# Patient Record
Sex: Female | Born: 1975 | Race: White | Hispanic: No | Marital: Married | Smoking: Former smoker
Health system: Southern US, Community
[De-identification: ages and names within clinical notes are randomized; demographics above are authoritative.]

---

## 2001-08-22 ENCOUNTER — Other Ambulatory Visit: Admission: RE | Admit: 2001-08-22 | Discharge: 2001-08-22 | Payer: Self-pay | Admitting: Obstetrics and Gynecology

## 2001-10-22 ENCOUNTER — Ambulatory Visit (HOSPITAL_COMMUNITY): Admission: RE | Admit: 2001-10-22 | Discharge: 2001-10-22 | Payer: Self-pay | Admitting: Obstetrics and Gynecology

## 2001-10-22 ENCOUNTER — Encounter: Payer: Self-pay | Admitting: Obstetrics and Gynecology

## 2001-11-11 ENCOUNTER — Ambulatory Visit (HOSPITAL_COMMUNITY): Admission: RE | Admit: 2001-11-11 | Discharge: 2001-11-11 | Payer: Self-pay | Admitting: Obstetrics and Gynecology

## 2001-11-11 ENCOUNTER — Encounter: Payer: Self-pay | Admitting: Obstetrics and Gynecology

## 2001-12-03 ENCOUNTER — Encounter: Admission: RE | Admit: 2001-12-03 | Discharge: 2001-12-03 | Payer: Self-pay | Admitting: Obstetrics and Gynecology

## 2001-12-17 ENCOUNTER — Ambulatory Visit (HOSPITAL_COMMUNITY): Admission: RE | Admit: 2001-12-17 | Discharge: 2001-12-17 | Payer: Self-pay | Admitting: Obstetrics and Gynecology

## 2001-12-17 ENCOUNTER — Encounter: Payer: Self-pay | Admitting: Obstetrics and Gynecology

## 2002-01-03 ENCOUNTER — Encounter: Payer: Self-pay | Admitting: Obstetrics and Gynecology

## 2002-01-03 ENCOUNTER — Ambulatory Visit (HOSPITAL_COMMUNITY): Admission: RE | Admit: 2002-01-03 | Discharge: 2002-01-03 | Payer: Self-pay | Admitting: Obstetrics and Gynecology

## 2002-01-31 ENCOUNTER — Encounter: Payer: Self-pay | Admitting: Obstetrics and Gynecology

## 2002-01-31 ENCOUNTER — Ambulatory Visit (HOSPITAL_COMMUNITY): Admission: RE | Admit: 2002-01-31 | Discharge: 2002-01-31 | Payer: Self-pay | Admitting: Obstetrics and Gynecology

## 2002-02-17 ENCOUNTER — Inpatient Hospital Stay (HOSPITAL_COMMUNITY): Admission: AD | Admit: 2002-02-17 | Discharge: 2002-02-20 | Payer: Self-pay | Admitting: Obstetrics and Gynecology

## 2002-02-21 ENCOUNTER — Encounter: Admission: RE | Admit: 2002-02-21 | Discharge: 2002-03-23 | Payer: Self-pay | Admitting: Obstetrics and Gynecology

## 2011-05-05 ENCOUNTER — Other Ambulatory Visit: Payer: Self-pay | Admitting: Otolaryngology

## 2011-05-05 DIAGNOSIS — J329 Chronic sinusitis, unspecified: Secondary | ICD-10-CM

## 2011-05-12 ENCOUNTER — Ambulatory Visit
Admission: RE | Admit: 2011-05-12 | Discharge: 2011-05-12 | Disposition: A | Discharge status: Home / Self Care | Payer: 59 | Source: Ambulatory Visit | Attending: Otolaryngology | Admitting: Otolaryngology

## 2011-05-12 DIAGNOSIS — J329 Chronic sinusitis, unspecified: Secondary | ICD-10-CM

## 2012-02-14 ENCOUNTER — Ambulatory Visit
Admission: RE | Admit: 2012-02-14 | Discharge: 2012-02-14 | Disposition: A | Discharge status: Home / Self Care | Payer: BC Managed Care – PPO | Source: Ambulatory Visit | Attending: Otolaryngology | Admitting: Otolaryngology

## 2012-02-14 ENCOUNTER — Other Ambulatory Visit: Payer: Self-pay | Admitting: Otolaryngology

## 2012-02-14 DIAGNOSIS — J329 Chronic sinusitis, unspecified: Secondary | ICD-10-CM

## 2012-03-08 ENCOUNTER — Other Ambulatory Visit: Payer: Self-pay | Admitting: Otolaryngology

## 2013-11-19 ENCOUNTER — Other Ambulatory Visit: Payer: Self-pay | Admitting: Obstetrics & Gynecology

## 2013-11-20 LAB — CYTOLOGY - PAP

## 2014-11-27 IMAGING — CT CT MAXILLOFACIAL W/O CM
3 series · 16 of 37 positions shown, 19 images · non-contrast
Comparison: CT scan 05/12/2011.

CLINICAL DATA: Sinusitis.  Fusion protocol.

CT MAXILLOFACIAL WITHOUT CONTRAST
TECHNIQUE: Multidetector CT imaging of the maxillofacial
structures was performed. Multiplanar CT image reconstructions were
also generated. Fusion protocol used.

[Series 3: axial soft 1.25 · axial · 0.46mm/px · z∈[-59,-42]mm · 2 of 116 slices shown]
[im 8/116  brain]
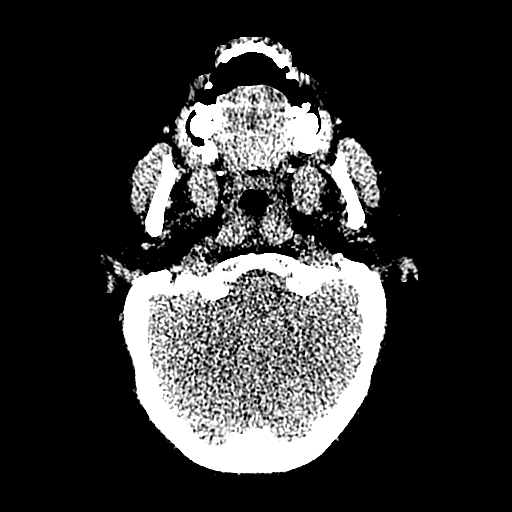
[im 22/116  brain]
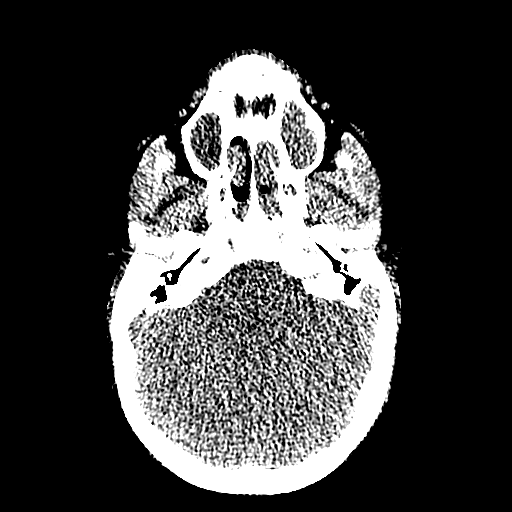

[Series 601: coronal facial · coronal · 0.46mm/px · 3 of 112 slices shown]
[im 38/112  bone]
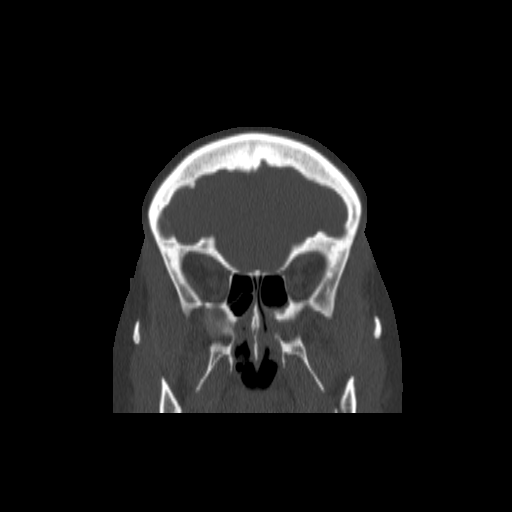
[im 56/112  bone]
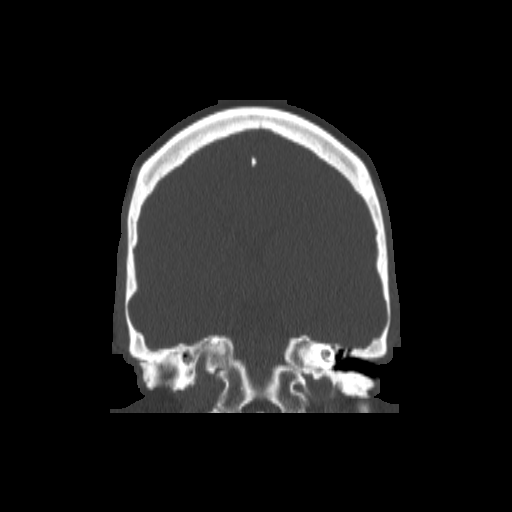
[im 75/112  bone]
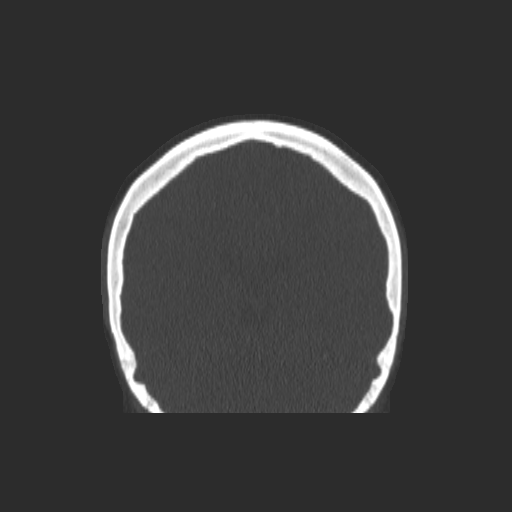

[Series 602: sagittal facial · sagittal · 0.46mm/px · 11 of 90 slices shown, 14 images]
[im 8/90  brain]
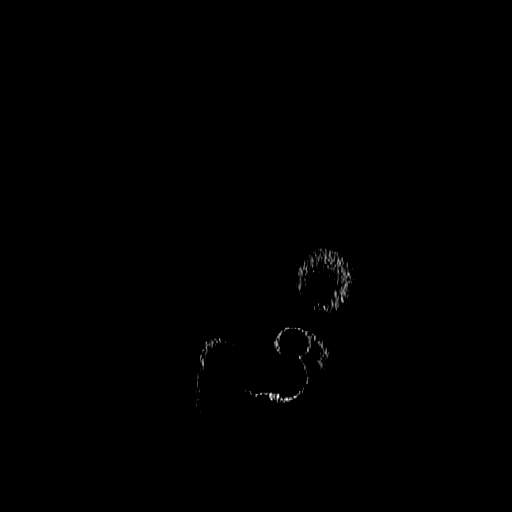
[im 8/90  bone]
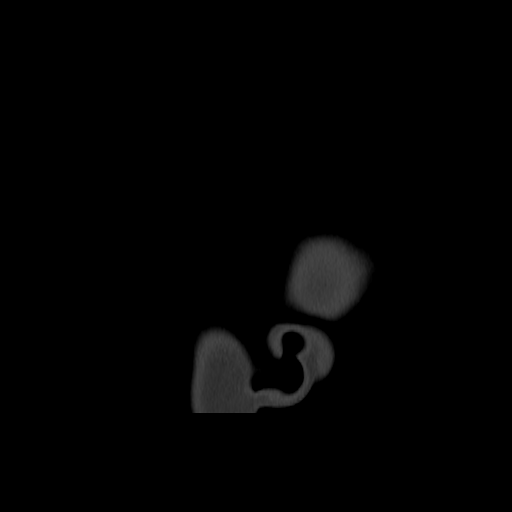
[im 15/90  bone]
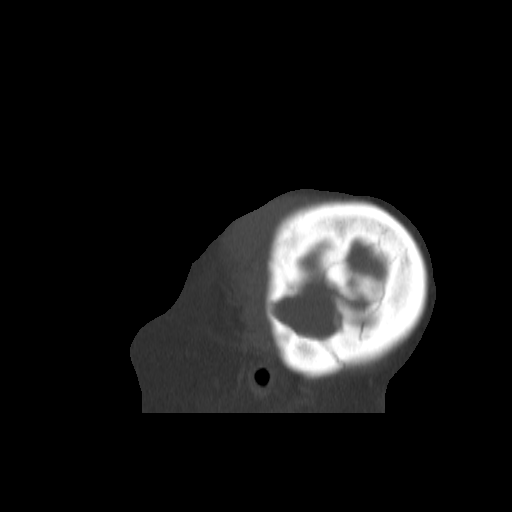
[im 23/90  bone]
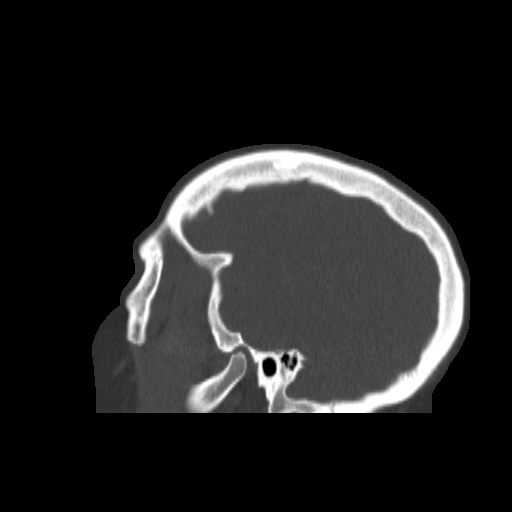
[im 30/90  bone]
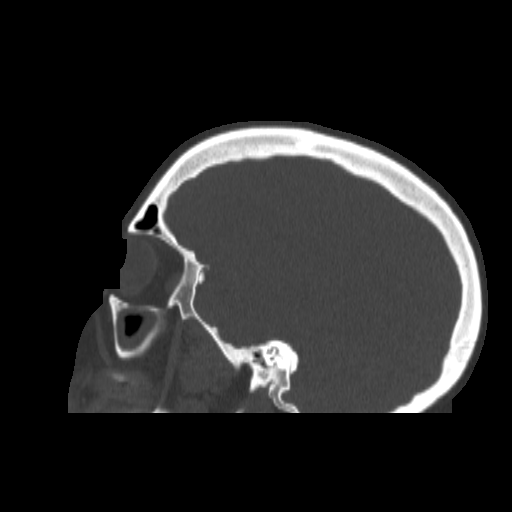
[im 38/90  brain]
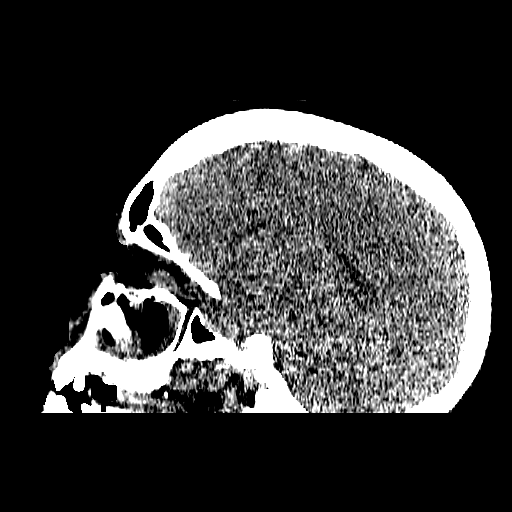
[im 38/90  bone]
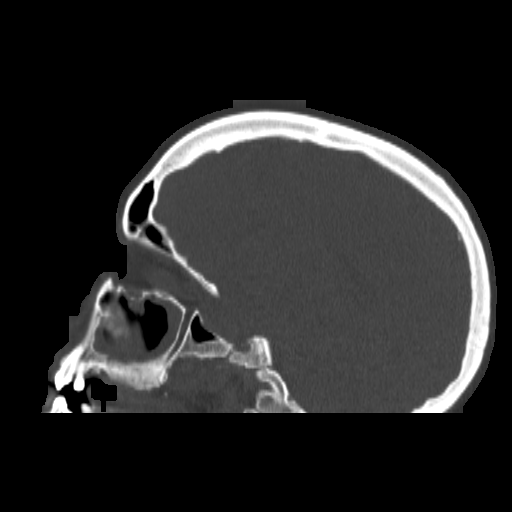
[im 45/90  bone]
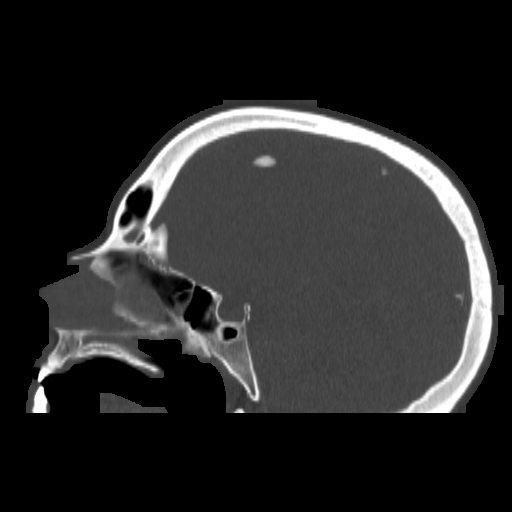
[im 52/90  bone]
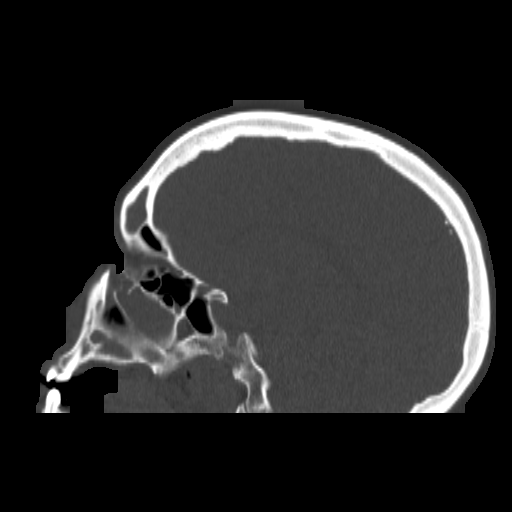
[im 60/90  bone]
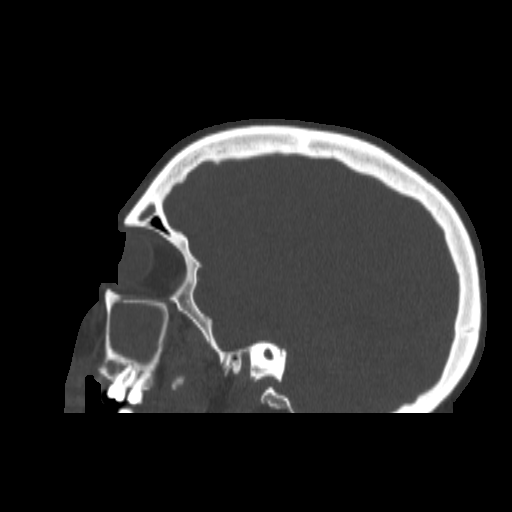
[im 67/90  brain]
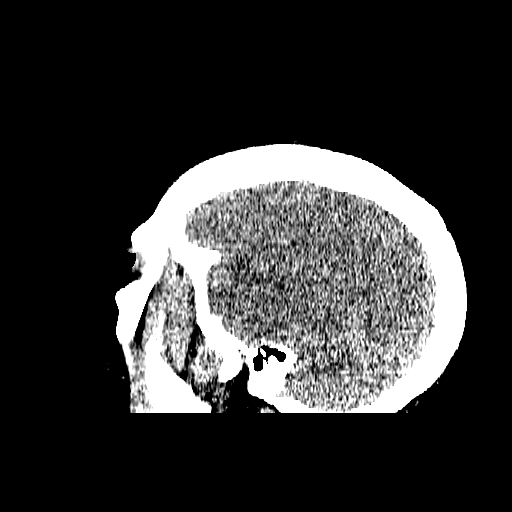
[im 67/90  bone]
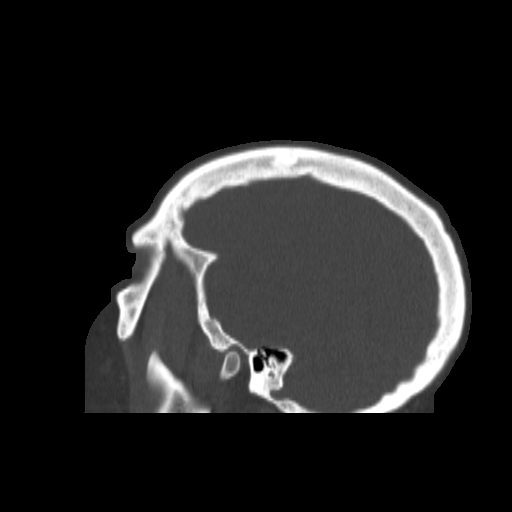
[im 75/90  bone]
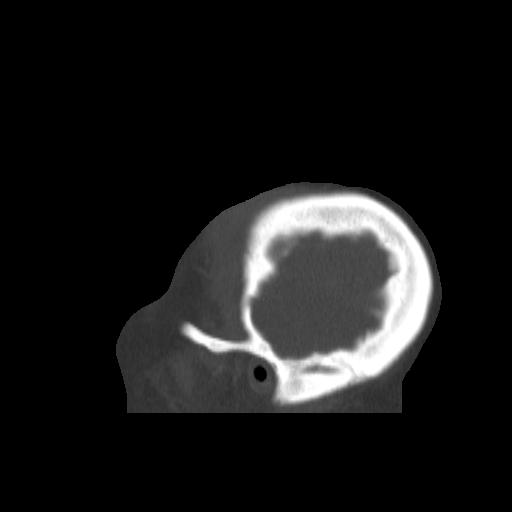
[im 82/90  bone]
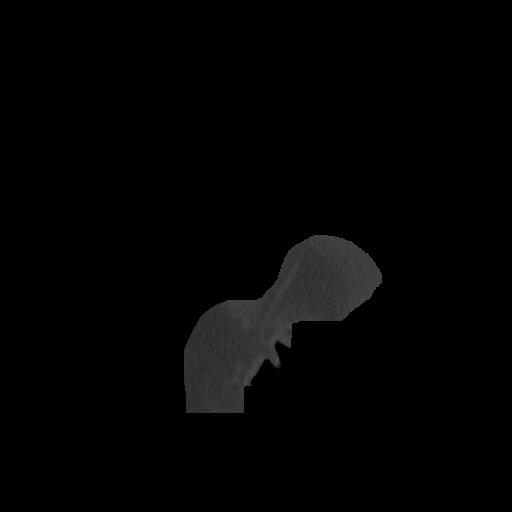

[16 of 37 positions shown; findings below may reference images not displayed]

FINDINGS: Pansinusitis persists with complete opacification of the
left frontal sinus and mild sinus wall thickening.  There is
scattered opacification and mucoperiosteal thickening involving the
ethmoid air cells.  Near-complete opacification of the left
maxillary sinus and severe mucoperiosteal thickening involving the
right maxillary sinus.  Minimal mucoperiosteal thickening involving
the sphenoid sinus.

The brain appears normal.  The mastoid air cells and middle ear
cavities are clear.  Globes are intact.
IMPRESSION: Pansinusitis.

## 2018-06-20 DIAGNOSIS — E282 Polycystic ovarian syndrome: Secondary | ICD-10-CM | POA: Diagnosis not present

## 2018-06-20 DIAGNOSIS — R7303 Prediabetes: Secondary | ICD-10-CM | POA: Diagnosis not present

## 2018-08-07 DIAGNOSIS — Z6841 Body Mass Index (BMI) 40.0 and over, adult: Secondary | ICD-10-CM | POA: Diagnosis not present

## 2018-08-07 DIAGNOSIS — N760 Acute vaginitis: Secondary | ICD-10-CM | POA: Diagnosis not present

## 2018-08-07 DIAGNOSIS — Z01419 Encounter for gynecological examination (general) (routine) without abnormal findings: Secondary | ICD-10-CM | POA: Diagnosis not present

## 2018-12-06 DIAGNOSIS — J22 Unspecified acute lower respiratory infection: Secondary | ICD-10-CM | POA: Diagnosis not present

## 2018-12-06 DIAGNOSIS — Z20828 Contact with and (suspected) exposure to other viral communicable diseases: Secondary | ICD-10-CM | POA: Diagnosis not present

## 2019-03-21 DIAGNOSIS — E119 Type 2 diabetes mellitus without complications: Secondary | ICD-10-CM | POA: Diagnosis not present

## 2019-07-04 DIAGNOSIS — Z6841 Body Mass Index (BMI) 40.0 and over, adult: Secondary | ICD-10-CM | POA: Diagnosis not present

## 2019-07-04 DIAGNOSIS — E669 Obesity, unspecified: Secondary | ICD-10-CM | POA: Diagnosis not present

## 2019-07-04 DIAGNOSIS — E119 Type 2 diabetes mellitus without complications: Secondary | ICD-10-CM | POA: Diagnosis not present

## 2019-07-04 DIAGNOSIS — E1169 Type 2 diabetes mellitus with other specified complication: Secondary | ICD-10-CM | POA: Diagnosis not present

## 2019-09-30 DIAGNOSIS — Z20822 Contact with and (suspected) exposure to covid-19: Secondary | ICD-10-CM | POA: Diagnosis not present

## 2020-01-12 DIAGNOSIS — Z3202 Encounter for pregnancy test, result negative: Secondary | ICD-10-CM | POA: Diagnosis not present

## 2020-01-12 DIAGNOSIS — Z01419 Encounter for gynecological examination (general) (routine) without abnormal findings: Secondary | ICD-10-CM | POA: Diagnosis not present

## 2020-01-12 DIAGNOSIS — R102 Pelvic and perineal pain: Secondary | ICD-10-CM | POA: Diagnosis not present

## 2020-02-11 DIAGNOSIS — F32A Depression, unspecified: Secondary | ICD-10-CM | POA: Diagnosis not present

## 2020-02-11 DIAGNOSIS — R45851 Suicidal ideations: Secondary | ICD-10-CM | POA: Diagnosis not present

## 2020-04-09 DIAGNOSIS — E119 Type 2 diabetes mellitus without complications: Secondary | ICD-10-CM | POA: Diagnosis not present

## 2020-04-09 DIAGNOSIS — F330 Major depressive disorder, recurrent, mild: Secondary | ICD-10-CM | POA: Diagnosis not present

## 2020-04-09 DIAGNOSIS — F411 Generalized anxiety disorder: Secondary | ICD-10-CM | POA: Diagnosis not present

## 2020-05-10 DIAGNOSIS — Z5321 Procedure and treatment not carried out due to patient leaving prior to being seen by health care provider: Secondary | ICD-10-CM | POA: Diagnosis not present

## 2020-05-10 DIAGNOSIS — R1031 Right lower quadrant pain: Secondary | ICD-10-CM | POA: Diagnosis not present

## 2020-05-11 DIAGNOSIS — K8020 Calculus of gallbladder without cholecystitis without obstruction: Secondary | ICD-10-CM | POA: Diagnosis not present

## 2020-05-11 DIAGNOSIS — R1011 Right upper quadrant pain: Secondary | ICD-10-CM | POA: Diagnosis not present

## 2020-05-14 DIAGNOSIS — K8010 Calculus of gallbladder with chronic cholecystitis without obstruction: Secondary | ICD-10-CM | POA: Diagnosis not present

## 2020-05-17 DIAGNOSIS — K8020 Calculus of gallbladder without cholecystitis without obstruction: Secondary | ICD-10-CM | POA: Diagnosis not present

## 2020-05-17 DIAGNOSIS — E119 Type 2 diabetes mellitus without complications: Secondary | ICD-10-CM | POA: Diagnosis not present

## 2020-05-17 DIAGNOSIS — K8010 Calculus of gallbladder with chronic cholecystitis without obstruction: Secondary | ICD-10-CM | POA: Diagnosis not present

## 2020-05-17 DIAGNOSIS — F17210 Nicotine dependence, cigarettes, uncomplicated: Secondary | ICD-10-CM | POA: Diagnosis not present

## 2020-05-17 DIAGNOSIS — Z48815 Encounter for surgical aftercare following surgery on the digestive system: Secondary | ICD-10-CM | POA: Diagnosis not present

## 2020-05-17 DIAGNOSIS — Z7984 Long term (current) use of oral hypoglycemic drugs: Secondary | ICD-10-CM | POA: Diagnosis not present

## 2020-05-17 DIAGNOSIS — R0683 Snoring: Secondary | ICD-10-CM | POA: Diagnosis not present

## 2020-05-17 DIAGNOSIS — H9192 Unspecified hearing loss, left ear: Secondary | ICD-10-CM | POA: Diagnosis not present

## 2020-05-17 DIAGNOSIS — Z6841 Body Mass Index (BMI) 40.0 and over, adult: Secondary | ICD-10-CM | POA: Diagnosis not present

## 2020-05-17 DIAGNOSIS — Z79899 Other long term (current) drug therapy: Secondary | ICD-10-CM | POA: Diagnosis not present

## 2020-05-28 DIAGNOSIS — Z09 Encounter for follow-up examination after completed treatment for conditions other than malignant neoplasm: Secondary | ICD-10-CM | POA: Diagnosis not present

## 2020-07-15 DIAGNOSIS — E119 Type 2 diabetes mellitus without complications: Secondary | ICD-10-CM | POA: Diagnosis not present

## 2020-07-15 DIAGNOSIS — F330 Major depressive disorder, recurrent, mild: Secondary | ICD-10-CM | POA: Diagnosis not present

## 2020-07-15 DIAGNOSIS — F411 Generalized anxiety disorder: Secondary | ICD-10-CM | POA: Diagnosis not present

## 2020-07-15 DIAGNOSIS — Z0000 Encounter for general adult medical examination without abnormal findings: Secondary | ICD-10-CM | POA: Diagnosis not present

## 2020-07-15 DIAGNOSIS — Z6841 Body Mass Index (BMI) 40.0 and over, adult: Secondary | ICD-10-CM | POA: Diagnosis not present

## 2020-07-15 DIAGNOSIS — Z1331 Encounter for screening for depression: Secondary | ICD-10-CM | POA: Diagnosis not present

## 2020-07-15 DIAGNOSIS — N809 Endometriosis, unspecified: Secondary | ICD-10-CM | POA: Diagnosis not present

## 2020-07-28 DIAGNOSIS — Z20828 Contact with and (suspected) exposure to other viral communicable diseases: Secondary | ICD-10-CM | POA: Diagnosis not present

## 2020-12-06 DIAGNOSIS — E119 Type 2 diabetes mellitus without complications: Secondary | ICD-10-CM | POA: Diagnosis not present

## 2021-04-04 DIAGNOSIS — R197 Diarrhea, unspecified: Secondary | ICD-10-CM | POA: Diagnosis not present

## 2021-04-05 DIAGNOSIS — R197 Diarrhea, unspecified: Secondary | ICD-10-CM | POA: Diagnosis not present

## 2021-04-06 DIAGNOSIS — R197 Diarrhea, unspecified: Secondary | ICD-10-CM | POA: Diagnosis not present

## 2021-04-14 DIAGNOSIS — R197 Diarrhea, unspecified: Secondary | ICD-10-CM | POA: Diagnosis not present

## 2021-04-14 DIAGNOSIS — R5383 Other fatigue: Secondary | ICD-10-CM | POA: Diagnosis not present

## 2021-04-14 DIAGNOSIS — R109 Unspecified abdominal pain: Secondary | ICD-10-CM | POA: Diagnosis not present

## 2021-04-14 DIAGNOSIS — R112 Nausea with vomiting, unspecified: Secondary | ICD-10-CM | POA: Diagnosis not present

## 2021-05-12 DIAGNOSIS — D124 Benign neoplasm of descending colon: Secondary | ICD-10-CM | POA: Diagnosis not present

## 2021-05-12 DIAGNOSIS — K635 Polyp of colon: Secondary | ICD-10-CM | POA: Diagnosis not present

## 2021-05-12 DIAGNOSIS — E119 Type 2 diabetes mellitus without complications: Secondary | ICD-10-CM | POA: Diagnosis not present

## 2021-05-12 DIAGNOSIS — E785 Hyperlipidemia, unspecified: Secondary | ICD-10-CM | POA: Diagnosis not present

## 2021-05-12 DIAGNOSIS — R197 Diarrhea, unspecified: Secondary | ICD-10-CM | POA: Diagnosis not present

## 2021-05-12 DIAGNOSIS — E7800 Pure hypercholesterolemia, unspecified: Secondary | ICD-10-CM | POA: Diagnosis not present

## 2021-05-12 DIAGNOSIS — R194 Change in bowel habit: Secondary | ICD-10-CM | POA: Diagnosis not present

## 2021-05-20 DIAGNOSIS — M50323 Other cervical disc degeneration at C6-C7 level: Secondary | ICD-10-CM | POA: Diagnosis not present

## 2021-05-20 DIAGNOSIS — M9901 Segmental and somatic dysfunction of cervical region: Secondary | ICD-10-CM | POA: Diagnosis not present

## 2021-05-20 DIAGNOSIS — M5413 Radiculopathy, cervicothoracic region: Secondary | ICD-10-CM | POA: Diagnosis not present

## 2021-05-23 DIAGNOSIS — M9901 Segmental and somatic dysfunction of cervical region: Secondary | ICD-10-CM | POA: Diagnosis not present

## 2021-05-23 DIAGNOSIS — M50323 Other cervical disc degeneration at C6-C7 level: Secondary | ICD-10-CM | POA: Diagnosis not present

## 2021-05-23 DIAGNOSIS — M5413 Radiculopathy, cervicothoracic region: Secondary | ICD-10-CM | POA: Diagnosis not present

## 2021-05-25 DIAGNOSIS — M5413 Radiculopathy, cervicothoracic region: Secondary | ICD-10-CM | POA: Diagnosis not present

## 2021-05-25 DIAGNOSIS — M9901 Segmental and somatic dysfunction of cervical region: Secondary | ICD-10-CM | POA: Diagnosis not present

## 2021-05-25 DIAGNOSIS — M50323 Other cervical disc degeneration at C6-C7 level: Secondary | ICD-10-CM | POA: Diagnosis not present

## 2021-05-26 DIAGNOSIS — M9901 Segmental and somatic dysfunction of cervical region: Secondary | ICD-10-CM | POA: Diagnosis not present

## 2021-05-26 DIAGNOSIS — M5413 Radiculopathy, cervicothoracic region: Secondary | ICD-10-CM | POA: Diagnosis not present

## 2021-05-26 DIAGNOSIS — M50323 Other cervical disc degeneration at C6-C7 level: Secondary | ICD-10-CM | POA: Diagnosis not present

## 2021-05-30 DIAGNOSIS — M50323 Other cervical disc degeneration at C6-C7 level: Secondary | ICD-10-CM | POA: Diagnosis not present

## 2021-05-30 DIAGNOSIS — M9901 Segmental and somatic dysfunction of cervical region: Secondary | ICD-10-CM | POA: Diagnosis not present

## 2021-05-30 DIAGNOSIS — M5413 Radiculopathy, cervicothoracic region: Secondary | ICD-10-CM | POA: Diagnosis not present

## 2021-06-02 DIAGNOSIS — M50323 Other cervical disc degeneration at C6-C7 level: Secondary | ICD-10-CM | POA: Diagnosis not present

## 2021-06-02 DIAGNOSIS — M5413 Radiculopathy, cervicothoracic region: Secondary | ICD-10-CM | POA: Diagnosis not present

## 2021-06-02 DIAGNOSIS — M9901 Segmental and somatic dysfunction of cervical region: Secondary | ICD-10-CM | POA: Diagnosis not present

## 2021-12-19 ENCOUNTER — Other Ambulatory Visit: Payer: Self-pay

## 2021-12-19 MED ORDER — ARIPIPRAZOLE 2 MG PO TABS: 2.0000 mg | ORAL_TABLET | Freq: Every day | ORAL | 0 refills | Status: DC

## 2022-03-08 ENCOUNTER — Other Ambulatory Visit: Payer: Self-pay

## 2022-03-08 MED ORDER — SERTRALINE HCL 50 MG PO TABS: 50.0000 mg | ORAL_TABLET | Freq: Three times a day (TID) | ORAL | 0 refills | Status: DC

## 2022-03-24 ENCOUNTER — Other Ambulatory Visit: Payer: Self-pay | Admitting: Internal Medicine

## 2022-03-27 ENCOUNTER — Encounter: Payer: Self-pay | Admitting: Internal Medicine

## 2022-03-27 ENCOUNTER — Ambulatory Visit: Payer: BC Managed Care – PPO | Admitting: Internal Medicine

## 2022-03-27 VITALS — BP 122/74 | HR 75 | Temp 97.2°F | Resp 18 | Ht 61.0 in | Wt 224.2 lb

## 2022-03-27 DIAGNOSIS — Z6841 Body Mass Index (BMI) 40.0 and over, adult: Secondary | ICD-10-CM | POA: Diagnosis not present

## 2022-03-27 DIAGNOSIS — Z0000 Encounter for general adult medical examination without abnormal findings: Secondary | ICD-10-CM | POA: Diagnosis not present

## 2022-03-27 DIAGNOSIS — E1169 Type 2 diabetes mellitus with other specified complication: Secondary | ICD-10-CM

## 2022-03-27 DIAGNOSIS — F3342 Major depressive disorder, recurrent, in full remission: Secondary | ICD-10-CM

## 2022-03-27 DIAGNOSIS — E785 Hyperlipidemia, unspecified: Secondary | ICD-10-CM | POA: Diagnosis not present

## 2022-03-27 DIAGNOSIS — K580 Irritable bowel syndrome with diarrhea: Secondary | ICD-10-CM

## 2022-03-27 DIAGNOSIS — F32A Depression, unspecified: Secondary | ICD-10-CM | POA: Insufficient documentation

## 2022-03-27 DIAGNOSIS — K589 Irritable bowel syndrome without diarrhea: Secondary | ICD-10-CM | POA: Insufficient documentation

## 2022-03-27 MED ORDER — CHOLESTYRAMINE 4 G PO PACK: 4.0000 g | PACK | Freq: Three times a day (TID) | ORAL | 12 refills | Status: AC

## 2022-03-27 NOTE — Progress Notes (Signed)
Office Visit  Subjective   Patient ID: Natalie Carrillo   DOB: 22-Nov-1975   Age: 47 y.o.   MRN: [id number]   Chief Complaint Chief Complaint  Patient presents with   Annual Exam    Physical annual exam     History of Present Illness   Natalie Carrillo is a 47 year old Caucasian/White female who presents for her annual health maintenance exam. She is due for the following health maintenance studies: mammogram, visual exam, and screening labs. This patient's past medical history Anxiety Disorder, Depression, Diabetes Mellitus, Type II, and Endometriosis.  Her last eye exam was done >3 years ago and she denies any problems with her vision. She has chronic diarrhea and was seen by Dr. Lyda Jester 2023 and colonoscopy was done and polyps were removed. Her next colonoscopy will be 5 years 2028. She was prescribed dicyclomine but she still has diarrhea. She has cholecystectomy bignning of 2023. [facility] and did not have [facility]ogram yet. She examine her breast. She is due for pap smear and [facility]. She does have a history of endometriosis where she is on birth control. She is not exercising regularly. The patient does get yearly flu vaccines. She has had 3 COVID-19 vaccines including 1 booster. There is a family history of CAD.     The patient is a 47 year old Caucasian/White female who returns for a follow-up visit for her T2 diabetes. She was initially diagnosed with PCOS with insulin resistance but in 08/2017 she was diagnosed with diabetes as her HgBA1c was 6.9 % last year. Since the last visit, there have been no problems. She does not check her sugar at home and does not take any medicine. .  She need to see eye doctor for diabetic eye examination.  The patient returns for followup of her depression and anxiety.  On her last visit, her depression and anxiety were not controlled.  I add abilify to her regimen and also gave her  scheduled clonazepam.  Today, she states she is doing better and has not had to use clonazepam in the last 3 weeks.  She is currently on Zoloft 150mg  po daily, ablify 2mg  daily and clonazepam 0.5mg  po q 12 hrs as needed.   Her husband think that she snore a lot and she says that when she get up in the morning, she does not feel fresh.  Her husband has parkinsonism, she quit smoking 1 year ago and occasionally drink red wine. She does get flu shot every year.   Past Medical History No past medical history on file.   Allergies No Known Allergies   Review of Systems Review of Systems  Constitutional: Negative.   HENT: Negative.    Respiratory: Negative.    Cardiovascular: Negative.   Gastrointestinal: Negative.   Neurological: Negative.   Psychiatric/Behavioral:  Negative for depression, substance abuse and suicidal ideas.        Objective:    Vitals BP 122/74 (BP Location: Left Arm, Patient Position: Sitting, Cuff Size: Normal)   Pulse 75   Temp (!) 97.2 F (36.2 C)   Resp 18   Ht 5\' 1"  (1.549 m)   Wt 224 lb 4 oz (101.7 kg)   LMP 03/09/2022   SpO2 98%   BMI 42.37 kg/m    Physical Examination Physical Exam Constitutional:      Appearance: Normal appearance. She is obese.  HENT:  Head: Normocephalic and atraumatic.  Eyes:     Extraocular Movements: Extraocular movements intact.     Pupils: Pupils are equal, round, and reactive to light.  Cardiovascular:     Rate and Rhythm: Normal rate and regular rhythm.     Heart sounds: Normal heart sounds.  Pulmonary:     Effort: Pulmonary effort is normal.     Breath sounds: Normal breath sounds.  Abdominal:     General: Bowel sounds are normal.     Palpations: Abdomen is soft.  Neurological:     General: No focal deficit present.     Mental Status: She is alert and oriented to person, place, and time.        Assessment & Plan:   IBS (irritable bowel syndrome) She take dicyclomine 20 mg one or twice a  day  Dyslipidemia due to type 2 diabetes mellitus ([place]) Will do HbA1c and lipid panel  Morbid obesity ([place]) Will discuss with option to control her sugar and weight.   Depression Currently controlled.     Return in about 1 week (around 04/03/2022).   Garwin Brothers, MD

## 2022-03-27 NOTE — Assessment & Plan Note (Signed)
She take dicyclomine 20 mg one or twice a day

## 2022-03-27 NOTE — Assessment & Plan Note (Signed)
Currently controlled.

## 2022-03-27 NOTE — Assessment & Plan Note (Signed)
Will discuss with option to control her sugar and weight.

## 2022-03-27 NOTE — Addendum Note (Signed)
Addended byLucile Shutters on: 03/27/2022 02:59 PM   Modules accepted: Orders

## 2022-03-27 NOTE — Assessment & Plan Note (Signed)
Will do HbA1c and lipid panel. 

## 2022-03-28 ENCOUNTER — Other Ambulatory Visit: Payer: Self-pay | Admitting: Internal Medicine

## 2022-03-29 DIAGNOSIS — E785 Hyperlipidemia, unspecified: Secondary | ICD-10-CM | POA: Diagnosis not present

## 2022-03-29 DIAGNOSIS — E1169 Type 2 diabetes mellitus with other specified complication: Secondary | ICD-10-CM | POA: Diagnosis not present

## 2022-03-30 LAB — LIPID PANEL
Chol/HDL Ratio: 2.9 ratio (ref 0.0–4.4)
Cholesterol, Total: 172 mg/dL (ref 100–199)
HDL: 59 mg/dL (ref >39)
LDL Chol Calc (NIH): 86 mg/dL (ref 0–99)
Triglycerides: 160 mg/dL — ABNORMAL HIGH (ref 0–149)
VLDL Cholesterol Cal: 27 mg/dL (ref 5–40)

## 2022-03-30 LAB — CBC WITH DIFFERENTIAL/PLATELET
Basophils Absolute: 0.1 10*3/uL (ref 0.0–0.2)
Basos: 1 %
EOS (ABSOLUTE): 0.1 10*3/uL (ref 0.0–0.4)
Eos: 2 %
Hematocrit: 42.3 % (ref 34.0–46.6)
Hemoglobin: 14.3 g/dL (ref 11.1–15.9)
Immature Grans (Abs): 0 10*3/uL (ref 0.0–0.1)
Immature Granulocytes: 0 %
Lymphocytes Absolute: 2.1 10*3/uL (ref 0.7–3.1)
Lymphs: 28 %
MCH: 32.5 pg (ref 26.6–33.0)
MCHC: 33.8 g/dL (ref 31.5–35.7)
MCV: 96 fL (ref 79–97)
Monocytes Absolute: 0.4 10*3/uL (ref 0.1–0.9)
Monocytes: 5 %
Neutrophils Absolute: 5 10*3/uL (ref 1.4–7.0)
Neutrophils: 64 %
Platelets: 252 10*3/uL (ref 150–450)
RBC: 4.4 x10E6/uL (ref 3.77–5.28)
RDW: 12.9 % (ref 11.7–15.4)
WBC: 7.7 10*3/uL (ref 3.4–10.8)

## 2022-03-30 LAB — CMP14 + ANION GAP
ALT: 46 IU/L — ABNORMAL HIGH (ref 0–32)
AST: 83 IU/L — ABNORMAL HIGH (ref 0–40)
Albumin/Globulin Ratio: 1.7 (ref 1.2–2.2)
Albumin: 4.5 g/dL (ref 3.9–4.9)
Alkaline Phosphatase: 116 IU/L (ref 44–121)
Anion Gap: 21 mmol/L — ABNORMAL HIGH (ref 10.0–18.0)
BUN/Creatinine Ratio: 11 (ref 9–23)
BUN: 8 mg/dL (ref 6–24)
Bilirubin Total: 0.3 mg/dL (ref 0.0–1.2)
CO2: 19 mmol/L — ABNORMAL LOW (ref 20–29)
Calcium: 9.8 mg/dL (ref 8.7–10.2)
Chloride: 101 mmol/L (ref 96–106)
Creatinine, Ser: 0.73 mg/dL (ref 0.57–1.00)
Globulin, Total: 2.6 g/dL (ref 1.5–4.5)
Glucose: 206 mg/dL — ABNORMAL HIGH (ref 70–99)
Potassium: 4.6 mmol/L (ref 3.5–5.2)
Sodium: 141 mmol/L (ref 134–144)
Total Protein: 7.1 g/dL (ref 6.0–8.5)
eGFR: 104 mL/min/{1.73_m2} (ref >59)

## 2022-03-30 LAB — HEMOGLOBIN A1C
Est. average glucose Bld gHb Est-mCnc: 151 mg/dL
Hgb A1c MFr Bld: 6.9 % — ABNORMAL HIGH (ref 4.8–5.6)

## 2022-03-30 LAB — TSH: TSH: 0.905 u[IU]/mL (ref 0.450–4.500)

## 2022-03-30 LAB — MICROALBUMIN / CREATININE URINE RATIO
Creatinine, Urine: 59.1 mg/dL
Microalb/Creat Ratio: <5 mg/g creat (ref 0–29)
Microalbumin, Urine: <3 ug/mL

## 2022-04-03 ENCOUNTER — Encounter: Payer: Self-pay | Admitting: Internal Medicine

## 2022-04-03 ENCOUNTER — Ambulatory Visit: Payer: BC Managed Care – PPO | Admitting: Internal Medicine

## 2022-04-03 DIAGNOSIS — E785 Hyperlipidemia, unspecified: Secondary | ICD-10-CM

## 2022-04-03 DIAGNOSIS — E1169 Type 2 diabetes mellitus with other specified complication: Secondary | ICD-10-CM

## 2022-04-03 MED ORDER — SEMAGLUTIDE(0.25 OR 0.5MG/DOS) 2 MG/3ML ~~LOC~~ SOPN: 0.2500 mg | PEN_INJECTOR | SUBCUTANEOUS | 1 refills | Status: DC

## 2022-04-03 MED ORDER — ARIPIPRAZOLE 2 MG PO TABS: 2.0000 mg | ORAL_TABLET | Freq: Every day | ORAL | 0 refills | Status: DC

## 2022-04-03 MED ORDER — CLONAZEPAM 0.5 MG PO TABS: 0.5000 mg | ORAL_TABLET | Freq: Every day | ORAL | 1 refills | Status: DC

## 2022-04-03 NOTE — Assessment & Plan Note (Signed)
I will star ozempic that should help with her diabetes and obesity

## 2022-04-03 NOTE — Progress Notes (Signed)
   Office Visit  Subjective   Patient ID: Malaiyah Leake   DOB: Aug 10, 1975   Age: 47 y.o.   MRN: [id number]   Chief Complaint Chief Complaint  Patient presents with   Follow-up    1 week follow up     History of Present Illness 47 years old female is here for follow up and discuss labs results. Her HbA1c is 6.9%. She use to take rosuvastatin but ran out of refill 8 months ago but her lipid panel is acceptable but will add rosuvastatin again.   She also has depression and need refill of sertraline and occasionallt she take clonazepam.   Past Medical History No past medical history on file.   Allergies No Known Allergies   Review of Systems Review of Systems  Constitutional: Negative.   HENT: Negative.    Respiratory: Negative.    Cardiovascular: Negative.   Gastrointestinal: Negative.   Neurological: Negative.        Objective:    Vitals BP 124/74 (BP Location: Left Arm, Patient Position: Sitting, Cuff Size: Normal)   Pulse 97   Temp 97.7 F (36.5 C)   Resp 18   Ht 5\' 1"  (1.549 m)   Wt 230 lb 2 oz (104.4 kg)   LMP 03/09/2022   SpO2 98%   BMI 43.48 kg/m    Physical Examination Physical Exam Constitutional:      Appearance: She is obese.  Cardiovascular:     Rate and Rhythm: Normal rate and regular rhythm.     Heart sounds: Normal heart sounds.  Pulmonary:     Effort: Pulmonary effort is normal.     Breath sounds: Normal breath sounds.  Abdominal:     General: Bowel sounds are normal.     Palpations: Abdomen is soft.  Neurological:     General: No focal deficit present.     Mental Status: She is oriented to person, place, and time.        Assessment & Plan:   Dyslipidemia due to type 2 diabetes mellitus ([place]) I will star ozempic that should help with her diabetes and obesity  Morbid obesity ([place]) She will continue to cut down her portion of meal.    Return in about 3 months (around 07/04/2022).   Garwin Brothers, MD

## 2022-04-03 NOTE — Assessment & Plan Note (Signed)
She will continue to cut down her portion of meal.

## 2022-04-19 ENCOUNTER — Other Ambulatory Visit: Payer: Self-pay | Admitting: Internal Medicine

## 2022-05-27 ENCOUNTER — Other Ambulatory Visit: Payer: Self-pay | Admitting: Internal Medicine

## 2022-06-29 ENCOUNTER — Other Ambulatory Visit: Payer: Self-pay | Admitting: Internal Medicine

## 2022-07-05 ENCOUNTER — Ambulatory Visit: Payer: BC Managed Care – PPO | Admitting: Internal Medicine

## 2022-08-05 ENCOUNTER — Other Ambulatory Visit: Payer: Self-pay | Admitting: Internal Medicine

## 2022-10-07 ENCOUNTER — Other Ambulatory Visit: Payer: Self-pay | Admitting: Internal Medicine

## 2022-12-07 ENCOUNTER — Other Ambulatory Visit: Payer: Self-pay | Admitting: Internal Medicine

## 2023-02-07 ENCOUNTER — Encounter: Payer: Self-pay | Admitting: Internal Medicine

## 2023-02-07 ENCOUNTER — Ambulatory Visit: Payer: BC Managed Care – PPO | Admitting: Internal Medicine

## 2023-02-07 VITALS — BP 128/74 | HR 74 | Temp 97.4°F | Resp 18 | Ht 61.0 in | Wt 207.0 lb

## 2023-02-07 DIAGNOSIS — E785 Hyperlipidemia, unspecified: Secondary | ICD-10-CM

## 2023-02-07 DIAGNOSIS — K580 Irritable bowel syndrome with diarrhea: Secondary | ICD-10-CM

## 2023-02-07 DIAGNOSIS — E1169 Type 2 diabetes mellitus with other specified complication: Secondary | ICD-10-CM | POA: Diagnosis not present

## 2023-02-07 DIAGNOSIS — F3342 Major depressive disorder, recurrent, in full remission: Secondary | ICD-10-CM

## 2023-02-07 MED ORDER — OZEMPIC (0.25 OR 0.5 MG/DOSE) 2 MG/3ML ~~LOC~~ SOPN: 0.2500 mg | PEN_INJECTOR | SUBCUTANEOUS | 6 refills | Status: DC

## 2023-02-07 MED ORDER — SERTRALINE HCL 50 MG PO TABS: 50.0000 mg | ORAL_TABLET | Freq: Three times a day (TID) | ORAL | 1 refills | Status: DC

## 2023-02-07 MED ORDER — ARIPIPRAZOLE 2 MG PO TABS: 2.0000 mg | ORAL_TABLET | Freq: Every day | ORAL | 0 refills | Status: DC

## 2023-02-07 MED ORDER — CLONAZEPAM 0.5 MG PO TABS: 0.5000 mg | ORAL_TABLET | Freq: Every day | ORAL | 2 refills | Status: DC

## 2023-02-07 NOTE — Progress Notes (Signed)
Office Visit  Subjective   Patient ID: Natalie Carrillo   DOB: 1975/10/20   Age: 48 y.o.   MRN: [id number]   Chief Complaint Chief Complaint  Patient presents with   Follow-up    Follow up     History of Present Illness The patient is a 48 year old Caucasian/White female who returns for a follow-up after 9 months. She works at [employer] and feel good. She follows with Gynecologist PCOS with insulin resistance once a year. She does not take any medication for it. They do her pap smear and mammogram.   She has type II Diabetes mellitus. She does not check her sugar at home. She takes ozympic but ran out of it and still have lost 18 pounds since last visit. She says that she has cut down portion of her meal. Her BMI today is 39 from 42. Her last HgBA1c was 6.9 % last year. She need to see eye doctor for diabetic eye examination.   The patient returns for followup of her depression and anxiety.  On her last visit, her depression and anxiety were not controlled.  I add abilify to her regimen and also gave her scheduled clonazepam.  Today, she states she is doing better and has not had to use clonazepam in the last 3 weeks.  She is currently on Zoloft 150mg  po daily, ablify 2mg  daily and clonazepam 0.5mg  po q 12 hrs as needed.   Her husband think that she snore a lot and she says that when she get up in the morning, she does not feel fresh.   Her husband has parkinsonism, she quit smoking 1 year ago and occasionally drink red wine. She does get flu shot every year.     Past Medical History No past medical history on file.   Allergies No Known Allergies   Review of Systems Review of Systems  Constitutional: Negative.   HENT: Negative.    Respiratory: Negative.    Cardiovascular: Negative.   Gastrointestinal: Negative.   Neurological: Negative.        Objective:    Vitals BP 128/74 (BP Location: Left Arm, Patient Position: Sitting, Cuff Size: Normal)   Pulse 74   Temp (!) 97.4  F (36.3 C)   Resp 18   Ht 5\' 1"  (1.549 m)   Wt 207 lb (93.9 kg)   SpO2 98%   BMI 39.11 kg/m    Physical Examination Physical Exam Constitutional:      Appearance: Normal appearance.  HENT:     Head: Normocephalic and atraumatic.  Cardiovascular:     Rate and Rhythm: Normal rate and regular rhythm.     Heart sounds: Normal heart sounds.  Pulmonary:     Effort: Pulmonary effort is normal.     Breath sounds: Normal breath sounds.  Abdominal:     General: Bowel sounds are normal.     Palpations: Abdomen is soft.  Neurological:     General: No focal deficit present.     Mental Status: She is alert and oriented to person, place, and time.        Assessment & Plan:   Morbid obesity ([facility]) Her BMI is 39 and with diabetes and dyslipidemia make it morbidly obese.   Dyslipidemia due to type 2 diabetes mellitus ([facility]) She is fasting today for labs include HbA1c, CMP and urine microalbuminuria.   Depression She take sertraline and abilify.   IBS (irritable bowel syndrome) Her diarrhea is better she does not  use cholestyramine regularly.     Return in about 3 months (around 05/08/2023).   Eloisa Northern, MD

## 2023-02-07 NOTE — Assessment & Plan Note (Signed)
Her BMI is 39 and with diabetes and dyslipidemia make it morbidly obese.

## 2023-02-07 NOTE — Assessment & Plan Note (Signed)
Her diarrhea is better she does not use cholestyramine regularly.

## 2023-02-07 NOTE — Assessment & Plan Note (Signed)
She is fasting today for labs include HbA1c, CMP and urine microalbuminuria.

## 2023-02-07 NOTE — Addendum Note (Signed)
Addended byRosalie Doctor on: 02/07/2023 09:14 AM   Modules accepted: Orders

## 2023-02-07 NOTE — Assessment & Plan Note (Signed)
She take sertraline and abilify.

## 2023-02-07 NOTE — Addendum Note (Signed)
Addended byEloisa Northern on: 02/07/2023 09:10 AM   Modules accepted: Orders

## 2023-02-08 LAB — LIPID PANEL
Chol/HDL Ratio: 2.4 {ratio} (ref 0.0–4.4)
Cholesterol, Total: 147 mg/dL (ref 100–199)
HDL: 62 mg/dL (ref >39)
LDL Chol Calc (NIH): 69 mg/dL (ref 0–99)
Triglycerides: 87 mg/dL (ref 0–149)
VLDL Cholesterol Cal: 16 mg/dL (ref 5–40)

## 2023-02-08 LAB — CMP14 + ANION GAP
ALT: 17 [IU]/L (ref 0–32)
AST: 16 [IU]/L (ref 0–40)
Albumin: 4.2 g/dL (ref 3.9–4.9)
Alkaline Phosphatase: 108 [IU]/L (ref 44–121)
Anion Gap: 16 mmol/L (ref 10.0–18.0)
BUN/Creatinine Ratio: 19 (ref 9–23)
BUN: 14 mg/dL (ref 6–24)
Bilirubin Total: <0.2 mg/dL (ref 0.0–1.2)
CO2: 23 mmol/L (ref 20–29)
Calcium: 10.1 mg/dL (ref 8.7–10.2)
Chloride: 107 mmol/L — ABNORMAL HIGH (ref 96–106)
Creatinine, Ser: 0.74 mg/dL (ref 0.57–1.00)
Globulin, Total: 2.1 g/dL (ref 1.5–4.5)
Glucose: 154 mg/dL — ABNORMAL HIGH (ref 70–99)
Potassium: 5 mmol/L (ref 3.5–5.2)
Sodium: 146 mmol/L — ABNORMAL HIGH (ref 134–144)
Total Protein: 6.3 g/dL (ref 6.0–8.5)
eGFR: 102 mL/min/{1.73_m2} (ref >59)

## 2023-02-08 LAB — MICROALBUMIN / CREATININE URINE RATIO
Creatinine, Urine: 50.8 mg/dL
Microalb/Creat Ratio: <6 mg/g{creat} (ref 0–29)
Microalbumin, Urine: <3 ug/mL

## 2023-02-08 LAB — HEMOGLOBIN A1C
Est. average glucose Bld gHb Est-mCnc: 134 mg/dL
Hgb A1c MFr Bld: 6.3 % — ABNORMAL HIGH (ref 4.8–5.6)

## 2023-02-12 NOTE — Progress Notes (Signed)
Patient called.  Patient aware. Her labs are good including sugar well controlled.

## 2023-05-04 ENCOUNTER — Ambulatory Visit: Payer: BC Managed Care – PPO | Admitting: Internal Medicine

## 2023-05-07 ENCOUNTER — Other Ambulatory Visit: Payer: Self-pay | Admitting: Internal Medicine

## 2023-08-10 ENCOUNTER — Ambulatory Visit: Admitting: Internal Medicine

## 2023-08-10 ENCOUNTER — Encounter: Payer: Self-pay | Admitting: Internal Medicine

## 2023-08-10 VITALS — BP 130/90 | HR 77 | Temp 97.2°F | Resp 18 | Ht 61.0 in | Wt 199.4 lb

## 2023-08-10 DIAGNOSIS — F3342 Major depressive disorder, recurrent, in full remission: Secondary | ICD-10-CM

## 2023-08-10 DIAGNOSIS — E785 Hyperlipidemia, unspecified: Secondary | ICD-10-CM

## 2023-08-10 DIAGNOSIS — E1169 Type 2 diabetes mellitus with other specified complication: Secondary | ICD-10-CM | POA: Diagnosis not present

## 2023-08-10 DIAGNOSIS — K580 Irritable bowel syndrome with diarrhea: Secondary | ICD-10-CM

## 2023-08-10 DIAGNOSIS — M898X1 Other specified disorders of bone, shoulder: Secondary | ICD-10-CM | POA: Insufficient documentation

## 2023-08-10 DIAGNOSIS — G8929 Other chronic pain: Secondary | ICD-10-CM | POA: Insufficient documentation

## 2023-08-10 MED ORDER — OZEMPIC (0.25 OR 0.5 MG/DOSE) 2 MG/1.5ML ~~LOC~~ SOPN: 0.5000 mg | PEN_INJECTOR | SUBCUTANEOUS | 6 refills | Status: AC

## 2023-08-10 MED ORDER — DICLOFENAC SODIUM 75 MG PO TBEC: 75.0000 mg | DELAYED_RELEASE_TABLET | Freq: Two times a day (BID) | ORAL | 0 refills | Status: DC

## 2023-08-10 MED ORDER — SERTRALINE HCL 50 MG PO TABS: 50.0000 mg | ORAL_TABLET | Freq: Three times a day (TID) | ORAL | 1 refills | Status: AC

## 2023-08-10 MED ORDER — CLONAZEPAM 0.5 MG PO TABS: 0.5000 mg | ORAL_TABLET | Freq: Every day | ORAL | 2 refills | Status: AC

## 2023-08-10 MED ORDER — ARIPIPRAZOLE 2 MG PO TABS: 2.0000 mg | ORAL_TABLET | Freq: Every day | ORAL | 0 refills | Status: AC

## 2023-08-10 NOTE — Assessment & Plan Note (Signed)
 I will start her on diclofenac sodium twice a day for 2 weeks then as needed.  I will also refer her to see orthopedic for evaluation.

## 2023-08-10 NOTE — Assessment & Plan Note (Signed)
 Her diabetes and cholesterol is well controlled.  She has stopped taking rosuvastatin many years ago.  Her LDL was target control.

## 2023-08-10 NOTE — Assessment & Plan Note (Signed)
 It is better with sertraline  150 mg daily and Abilify  2 mg daily.

## 2023-08-10 NOTE — Assessment & Plan Note (Signed)
 Her BMI is 36.  With underlying diabetes make it morbidly obese.  I will increase the dose of Ozempic  to 0.5 mg weekly.

## 2023-08-10 NOTE — Progress Notes (Signed)
 Office Visit  Subjective   Patient ID: Natalie Carrillo   DOB: 10/19/1975   Age: 48 y.o.   MRN: [id number]   Chief Complaint Chief Complaint  Patient presents with   Follow-up    With A1C check     History of Present Illness The patient is a 48 year old Caucasian/White female who returns for a follow-up.   She says that she has a pain in her right scapula for long time but it is really getting worse.  She says that she cannot sleep because of this pain.  She says that sometimes the pain is so sharp that she cannot even breathe.  She is asking for some medication for that.   She has type II Diabetes mellitus. She does not check her sugar at home. She takes ozympic  0.25 mg weekly.  She has lost 4 lb since last visit. Her weight is 199 lb with BMI of 36.  Her hemoglobin A1c in January was 6 point 3%.. She need to see eye doctor for diabetic eye examination.    The patient returns for followup of her depression and anxiety.   She takes sertraline  150 mg and Abilify  2 mg daily.  She says that some day she gets really overwhelmed and does not want do anything.  She occasionally use clonazepam .  She denies having any side effects. She does not have any suicidal ideation.  She has caregiver of her husband who has a parkinsonism that is contributing to her anxiety as well.  Past Medical History History reviewed. No pertinent past medical history.   Allergies No Known Allergies   Review of Systems Review of Systems  Constitutional: Negative.   HENT: Negative.    Respiratory: Negative.    Cardiovascular: Negative.   Gastrointestinal: Negative.   Musculoskeletal:        Right scapula pain radiating to neck  Neurological: Negative.        Objective:    Vitals BP (!) 130/90   Pulse 77   Temp (!) 97.2 F (36.2 C)   Resp 18   Ht 5' 1 (1.549 m)   Wt 199 lb 6 oz (90.4 kg)   SpO2 98%   BMI 37.67 kg/m    Physical Examination Physical Exam Constitutional:      Appearance: Normal  appearance. She is obese.  HENT:     Head: Normocephalic and atraumatic.  Cardiovascular:     Rate and Rhythm: Normal rate and regular rhythm.     Heart sounds: Normal heart sounds.  Pulmonary:     Effort: Pulmonary effort is normal.     Breath sounds: Normal breath sounds.  Abdominal:     General: Bowel sounds are normal.     Palpations: Abdomen is soft.  Musculoskeletal:     Comments: Swelling right scapula area  Neurological:     Mental Status: She is alert and oriented to person, place, and time. Mental status is at baseline.        Assessment & Plan:   Morbid obesity ([facility])  Her BMI is 36.  With underlying diabetes make it morbidly obese.  I will increase the dose of Ozempic  to 0.5 mg weekly.  Depression   It is better with sertraline  150 mg daily and Abilify  2 mg daily.  Dyslipidemia due to type 2 diabetes mellitus ([facility])   Her diabetes and cholesterol is well controlled.  She has stopped taking rosuvastatin many years ago.  Her LDL was target control.  Pain of right scapula   I will start her on diclofenac sodium twice a day for 2 weeks then as needed.  I will also refer her to see orthopedic for evaluation.    Return in about 3 months (around 11/10/2023).   Roetta Dare, MD

## 2023-10-06 ENCOUNTER — Other Ambulatory Visit: Payer: Self-pay | Admitting: Internal Medicine

## 2023-11-12 ENCOUNTER — Ambulatory Visit: Admitting: Internal Medicine
